# Patient Record
Sex: Female | Born: 1960
Health system: Southern US, Community
[De-identification: ages and names within clinical notes are randomized; demographics above are authoritative.]

---

## 2003-08-10 ENCOUNTER — Other Ambulatory Visit: Admission: RE | Admit: 2003-08-10 | Discharge: 2003-08-10 | Payer: Self-pay | Admitting: Family Medicine

## 2003-08-16 ENCOUNTER — Encounter: Admission: RE | Admit: 2003-08-16 | Discharge: 2003-08-16 | Payer: Self-pay | Admitting: Family Medicine

## 2004-02-28 ENCOUNTER — Ambulatory Visit (HOSPITAL_COMMUNITY): Admission: RE | Admit: 2004-02-28 | Discharge: 2004-02-28 | Payer: Self-pay | Admitting: Specialist

## 2004-10-10 ENCOUNTER — Other Ambulatory Visit: Admission: RE | Admit: 2004-10-10 | Discharge: 2004-10-10 | Payer: Self-pay | Admitting: Family Medicine

## 2004-11-01 ENCOUNTER — Ambulatory Visit (HOSPITAL_COMMUNITY): Admission: RE | Admit: 2004-11-01 | Discharge: 2004-11-01 | Payer: Self-pay | Admitting: Family Medicine

## 2007-10-29 ENCOUNTER — Other Ambulatory Visit: Admission: RE | Admit: 2007-10-29 | Discharge: 2007-10-29 | Payer: Self-pay | Admitting: Family Medicine

## 2007-11-11 ENCOUNTER — Ambulatory Visit (HOSPITAL_COMMUNITY): Admission: RE | Admit: 2007-11-11 | Discharge: 2007-11-11 | Payer: Self-pay | Admitting: Family Medicine

## 2009-03-30 ENCOUNTER — Emergency Department (HOSPITAL_COMMUNITY): Admission: EM | Admit: 2009-03-30 | Discharge: 2009-03-30 | Payer: Self-pay | Admitting: Emergency Medicine

## 2010-02-10 ENCOUNTER — Encounter: Payer: Self-pay | Admitting: Family Medicine

## 2010-02-11 ENCOUNTER — Encounter: Payer: Self-pay | Admitting: Family Medicine

## 2010-04-03 ENCOUNTER — Other Ambulatory Visit (HOSPITAL_COMMUNITY)
Admission: RE | Admit: 2010-04-03 | Discharge: 2010-04-03 | Disposition: A | Discharge status: Home / Self Care | Payer: BC Managed Care – PPO | Source: Ambulatory Visit | Attending: Family Medicine | Admitting: Family Medicine

## 2010-04-03 ENCOUNTER — Other Ambulatory Visit: Payer: Self-pay | Admitting: Family Medicine

## 2010-04-03 DIAGNOSIS — Z01419 Encounter for gynecological examination (general) (routine) without abnormal findings: Secondary | ICD-10-CM | POA: Insufficient documentation

## 2010-04-16 LAB — POCT I-STAT, CHEM 8
BUN: 10 mg/dL (ref 6–23)
Calcium, Ion: 1.14 mmol/L (ref 1.12–1.32)
Chloride: 107 mEq/L (ref 96–112)
Creatinine, Ser: 0.5 mg/dL (ref 0.4–1.2)
Glucose, Bld: 121 mg/dL — ABNORMAL HIGH (ref 70–99)
HCT: 26 % — ABNORMAL LOW (ref 36.0–46.0)
Hemoglobin: 8.8 g/dL — ABNORMAL LOW (ref 12.0–15.0)
Potassium: 3.7 mEq/L (ref 3.5–5.1)
Sodium: 137 mEq/L (ref 135–145)
TCO2: 24 mmol/L (ref 0–100)

## 2010-04-16 LAB — CBC
HCT: 25.5 % — ABNORMAL LOW (ref 36.0–46.0)
Hemoglobin: 8.4 g/dL — ABNORMAL LOW (ref 12.0–15.0)
MCHC: 32.8 g/dL (ref 30.0–36.0)
MCV: 76.2 fL — ABNORMAL LOW (ref 78.0–100.0)
Platelets: 382 10*3/uL (ref 150–400)
RBC: 3.34 MIL/uL — ABNORMAL LOW (ref 3.87–5.11)
RDW: 16.8 % — ABNORMAL HIGH (ref 11.5–15.5)
WBC: 8.1 10*3/uL (ref 4.0–10.5)

## 2010-04-16 LAB — D-DIMER, QUANTITATIVE: D-Dimer, Quant: <0.22 ug/mL-FEU (ref 0.00–0.48)

## 2011-02-06 ENCOUNTER — Other Ambulatory Visit: Payer: Self-pay | Admitting: Obstetrics and Gynecology

## 2013-06-24 ENCOUNTER — Other Ambulatory Visit (HOSPITAL_COMMUNITY): Payer: Self-pay | Admitting: Family Medicine

## 2013-06-24 DIAGNOSIS — Z1231 Encounter for screening mammogram for malignant neoplasm of breast: Secondary | ICD-10-CM

## 2013-06-25 ENCOUNTER — Ambulatory Visit (HOSPITAL_COMMUNITY): Admission: RE | Admit: 2013-06-25 | Payer: BC Managed Care – PPO | Source: Ambulatory Visit

## 2013-06-25 ENCOUNTER — Encounter (INDEPENDENT_AMBULATORY_CARE_PROVIDER_SITE_OTHER): Payer: Self-pay

## 2013-10-15 ENCOUNTER — Other Ambulatory Visit (HOSPITAL_COMMUNITY)
Admission: RE | Admit: 2013-10-15 | Discharge: 2013-10-15 | Disposition: A | Discharge status: Home / Self Care | Payer: BC Managed Care – PPO | Source: Ambulatory Visit | Attending: Family Medicine | Admitting: Family Medicine

## 2013-10-15 ENCOUNTER — Other Ambulatory Visit: Payer: Self-pay | Admitting: Family Medicine

## 2013-10-15 DIAGNOSIS — Z1151 Encounter for screening for human papillomavirus (HPV): Secondary | ICD-10-CM | POA: Diagnosis present

## 2013-10-15 DIAGNOSIS — Z124 Encounter for screening for malignant neoplasm of cervix: Secondary | ICD-10-CM | POA: Diagnosis not present

## 2013-10-18 LAB — CYTOLOGY - PAP

## 2015-06-12 DIAGNOSIS — J0100 Acute maxillary sinusitis, unspecified: Secondary | ICD-10-CM | POA: Diagnosis not present

## 2015-06-12 DIAGNOSIS — R05 Cough: Secondary | ICD-10-CM | POA: Diagnosis not present

## 2015-06-12 DIAGNOSIS — J189 Pneumonia, unspecified organism: Secondary | ICD-10-CM | POA: Diagnosis not present

## 2015-10-11 DIAGNOSIS — R05 Cough: Secondary | ICD-10-CM | POA: Diagnosis not present

## 2015-11-30 DIAGNOSIS — Z0000 Encounter for general adult medical examination without abnormal findings: Secondary | ICD-10-CM | POA: Diagnosis not present

## 2015-11-30 DIAGNOSIS — R7303 Prediabetes: Secondary | ICD-10-CM | POA: Diagnosis not present

## 2015-11-30 DIAGNOSIS — Z1159 Encounter for screening for other viral diseases: Secondary | ICD-10-CM | POA: Diagnosis not present

## 2015-11-30 DIAGNOSIS — Z1211 Encounter for screening for malignant neoplasm of colon: Secondary | ICD-10-CM | POA: Diagnosis not present

## 2016-01-16 DIAGNOSIS — J181 Lobar pneumonia, unspecified organism: Secondary | ICD-10-CM | POA: Diagnosis not present

## 2016-01-20 DIAGNOSIS — J209 Acute bronchitis, unspecified: Secondary | ICD-10-CM | POA: Diagnosis not present

## 2016-01-20 DIAGNOSIS — R0981 Nasal congestion: Secondary | ICD-10-CM | POA: Diagnosis not present

## 2016-01-20 DIAGNOSIS — J3489 Other specified disorders of nose and nasal sinuses: Secondary | ICD-10-CM | POA: Diagnosis not present

## 2016-01-20 DIAGNOSIS — R05 Cough: Secondary | ICD-10-CM | POA: Diagnosis not present

## 2016-09-13 DIAGNOSIS — R05 Cough: Secondary | ICD-10-CM | POA: Diagnosis not present

## 2016-09-13 DIAGNOSIS — J069 Acute upper respiratory infection, unspecified: Secondary | ICD-10-CM | POA: Diagnosis not present

## 2016-09-17 ENCOUNTER — Other Ambulatory Visit: Payer: Self-pay | Admitting: Family Medicine

## 2016-09-17 DIAGNOSIS — Z1231 Encounter for screening mammogram for malignant neoplasm of breast: Secondary | ICD-10-CM

## 2016-09-25 ENCOUNTER — Ambulatory Visit
Admission: RE | Admit: 2016-09-25 | Discharge: 2016-09-25 | Disposition: A | Discharge status: Home / Self Care | Payer: Self-pay | Source: Ambulatory Visit | Attending: Family Medicine | Admitting: Family Medicine

## 2016-09-25 DIAGNOSIS — Z1231 Encounter for screening mammogram for malignant neoplasm of breast: Secondary | ICD-10-CM | POA: Diagnosis not present

## 2016-12-04 DIAGNOSIS — H2513 Age-related nuclear cataract, bilateral: Secondary | ICD-10-CM | POA: Diagnosis not present

## 2016-12-04 DIAGNOSIS — E119 Type 2 diabetes mellitus without complications: Secondary | ICD-10-CM | POA: Diagnosis not present

## 2016-12-04 DIAGNOSIS — H11002 Unspecified pterygium of left eye: Secondary | ICD-10-CM | POA: Diagnosis not present

## 2016-12-04 DIAGNOSIS — H04123 Dry eye syndrome of bilateral lacrimal glands: Secondary | ICD-10-CM | POA: Diagnosis not present

## 2016-12-25 DIAGNOSIS — R7303 Prediabetes: Secondary | ICD-10-CM | POA: Diagnosis not present

## 2016-12-25 DIAGNOSIS — Z13220 Encounter for screening for lipoid disorders: Secondary | ICD-10-CM | POA: Diagnosis not present

## 2018-01-28 ENCOUNTER — Other Ambulatory Visit (HOSPITAL_COMMUNITY)
Admission: RE | Admit: 2018-01-28 | Discharge: 2018-01-28 | Disposition: A | Discharge status: Home / Self Care | Payer: BLUE CROSS/BLUE SHIELD | Source: Ambulatory Visit | Attending: Family Medicine | Admitting: Family Medicine

## 2018-01-28 ENCOUNTER — Other Ambulatory Visit: Payer: Self-pay | Admitting: Family Medicine

## 2018-01-28 DIAGNOSIS — Z124 Encounter for screening for malignant neoplasm of cervix: Secondary | ICD-10-CM | POA: Diagnosis not present

## 2018-01-28 DIAGNOSIS — E7800 Pure hypercholesterolemia, unspecified: Secondary | ICD-10-CM | POA: Diagnosis not present

## 2018-01-28 DIAGNOSIS — R7303 Prediabetes: Secondary | ICD-10-CM | POA: Diagnosis not present

## 2018-01-28 DIAGNOSIS — Z23 Encounter for immunization: Secondary | ICD-10-CM | POA: Diagnosis not present

## 2018-01-28 DIAGNOSIS — Z0000 Encounter for general adult medical examination without abnormal findings: Secondary | ICD-10-CM | POA: Diagnosis not present

## 2018-01-29 LAB — CYTOLOGY - PAP
Diagnosis: NEGATIVE
HPV: NOT DETECTED

## 2018-07-08 DIAGNOSIS — H11042 Peripheral pterygium, stationary, left eye: Secondary | ICD-10-CM | POA: Diagnosis not present

## 2018-07-08 DIAGNOSIS — H04123 Dry eye syndrome of bilateral lacrimal glands: Secondary | ICD-10-CM | POA: Diagnosis not present

## 2018-07-08 DIAGNOSIS — R7309 Other abnormal glucose: Secondary | ICD-10-CM | POA: Diagnosis not present

## 2018-07-08 DIAGNOSIS — H2513 Age-related nuclear cataract, bilateral: Secondary | ICD-10-CM | POA: Diagnosis not present

## 2018-07-10 DIAGNOSIS — R300 Dysuria: Secondary | ICD-10-CM | POA: Diagnosis not present

## 2018-07-10 DIAGNOSIS — R350 Frequency of micturition: Secondary | ICD-10-CM | POA: Diagnosis not present

## 2018-07-10 DIAGNOSIS — R3915 Urgency of urination: Secondary | ICD-10-CM | POA: Diagnosis not present

## 2019-03-24 ENCOUNTER — Encounter: Payer: Self-pay | Admitting: Family Medicine

## 2019-04-01 ENCOUNTER — Other Ambulatory Visit: Payer: Self-pay | Admitting: Family Medicine

## 2019-04-01 DIAGNOSIS — Z1231 Encounter for screening mammogram for malignant neoplasm of breast: Secondary | ICD-10-CM

## 2019-05-26 ENCOUNTER — Encounter: Payer: Self-pay | Admitting: Registered"

## 2019-05-26 ENCOUNTER — Encounter: Payer: 59 | Attending: Family Medicine | Admitting: Registered"

## 2019-05-26 ENCOUNTER — Other Ambulatory Visit: Payer: Self-pay

## 2019-05-26 DIAGNOSIS — R7303 Prediabetes: Secondary | ICD-10-CM | POA: Diagnosis present

## 2019-05-26 NOTE — Patient Instructions (Addendum)
-   Aim to have 1/2 plate non-starchy vegetables + 1/4 plate of protein + 1/4 plate of starch/grain for lunch and dinner. See handouts.   - Can have snack after dinner to include source of carbohydrates + protein such as fruit + nut, yogurt + nuts, etc.   - Keep up the great work with movement during the week; walking every other day at least 30 minutes.

## 2019-05-26 NOTE — Progress Notes (Signed)
Medical Nutrition Therapy:  Appt start time: 9:25 end time:  10:15.   Assessment:  Primary concerns today: Per referral, recent labs elevated A1c (6.4), Chol (186), and Trg (183).   Pt expectations: wants to know what helps you control diabetes levels & daily caloric amounts  Pt states her doctor wants her to be in good health. Has been working with her for 15+ years. Pt state she wants to try and avoid medication and manage things on her own.   Pt reports when labs were performed she recently jad family visiting, daughter got engaged, and she ate a lot during that time.   Reports she has lost 6-7 lbs since previous visit. States she has been very strict on food items: eating vegetables.  States she follows vegetarian diet + eggs; since 2000. States she will have sugar sometimes with tea. Reports she is no longer eating cakes or any other sweets. States she is drinking a lot of herbal teas and increasing intake of bitter melon. Reports some days she is having less than 1000 cals and sometimes 1200 cals. States she loves yogurt, does not want to add fat from milk although she likes milk. Reports doing intermitent fasting 10-6 pm currently; feels hungry daily around 8 pm but does not want to eat at that time. States tomatoes give her acid reflux; avoids them. States she goes to bed around 9-10 pm.   Sometimes she cannot manage of stress well. Works as Product manager. Reports virus effects in home country and some stress/concerns related to how its affecting family and people she's connected with.    Preferred Learning Style:   No preference indicated   Learning Readiness:   Ready  Change in progress   MEDICATIONS: See list   DIETARY INTAKE:  Usual eating pattern includes 2 meals and 1 snacks per day.  Everyday foods include fruit, pancakes, blueberries, nuts, yogurt, rice, vegetables, eggs.  Avoided foods include white flour, white rice, sugar, sugary things, tomatoes and  grapes.    24-hr recall:  B ( AM): buckwheat pancake + blueberries + 16 oz coffee + water  Snk ( AM):  L ( PM): yogurt  + nuts Snk ( PM):  D ( PM): gava rice + kale + beets + soup (tomato + potato + egg) Snk ( PM): herbal tea (sage, thyme, lemon bong) Beverages: herbal tea, coffee, water  Usual physical activity: walking 45 min, 4x/week  Estimated energy needs: 1800 calories 200 g carbohydrates 135 g protein 50 g fat  Progress Towards Goal(s):  In progress.   Nutritional Diagnosis:  NB-1.1 Food and nutrition-related knowledge deficit As related to prediabetes.  As evidenced by verbalized incomplete information.    Intervention:  Nutrition education and counseling. Pt was educated and counseled on prediabetes, how carbohydrates work in the body, role of fiber in eating regimen, and importance of physical activity. Discussed meal/snack planning and how to balance meals/snacks using MyPlate for prediabetes. Pt was in agreement with goals listed. Goals: - Aim to have 1/2 plate non-starchy vegetables + 1/4 plate of protein + 1/4 plate of starch/grain for lunch and dinner. See handouts.  - Can have snack after dinner to include source of carbohydrates + protein such as fruit + nut, yogurt + nuts, etc.  - Keep up the great work with movement during the week; walking every other day at least 30 minutes.    Teaching Method Utilized:  Visual Auditory Hands on  Handouts given during visit include:  My Plate for prediabtes  Barriers to learning/adherence to lifestyle change: none identifid  Demonstrated degree of understanding via:  Teach Back   Monitoring/Evaluation:  Dietary intake, exercise, and body weight prn.

## 2020-08-16 ENCOUNTER — Encounter (INDEPENDENT_AMBULATORY_CARE_PROVIDER_SITE_OTHER): Payer: Self-pay

## 2020-09-12 ENCOUNTER — Other Ambulatory Visit: Payer: Self-pay | Admitting: Family Medicine

## 2020-09-12 DIAGNOSIS — E2839 Other primary ovarian failure: Secondary | ICD-10-CM

## 2020-09-12 DIAGNOSIS — Z1231 Encounter for screening mammogram for malignant neoplasm of breast: Secondary | ICD-10-CM

## 2020-09-12 DIAGNOSIS — Z780 Asymptomatic menopausal state: Secondary | ICD-10-CM

## 2020-09-19 ENCOUNTER — Encounter (INDEPENDENT_AMBULATORY_CARE_PROVIDER_SITE_OTHER): Payer: Self-pay

## 2020-09-26 ENCOUNTER — Encounter (INDEPENDENT_AMBULATORY_CARE_PROVIDER_SITE_OTHER): Payer: Self-pay | Admitting: Ophthalmology

## 2020-09-26 ENCOUNTER — Encounter (INDEPENDENT_AMBULATORY_CARE_PROVIDER_SITE_OTHER): Payer: 59 | Admitting: Ophthalmology

## 2020-09-26 ENCOUNTER — Other Ambulatory Visit: Payer: Self-pay

## 2020-09-26 ENCOUNTER — Ambulatory Visit (INDEPENDENT_AMBULATORY_CARE_PROVIDER_SITE_OTHER): Payer: 59 | Admitting: Ophthalmology

## 2020-09-26 DIAGNOSIS — H2513 Age-related nuclear cataract, bilateral: Secondary | ICD-10-CM | POA: Diagnosis not present

## 2020-09-26 DIAGNOSIS — H35371 Puckering of macula, right eye: Secondary | ICD-10-CM | POA: Insufficient documentation

## 2020-09-26 DIAGNOSIS — H33101 Unspecified retinoschisis, right eye: Secondary | ICD-10-CM | POA: Insufficient documentation

## 2020-09-26 DIAGNOSIS — H2512 Age-related nuclear cataract, left eye: Secondary | ICD-10-CM | POA: Insufficient documentation

## 2020-09-26 NOTE — Assessment & Plan Note (Addendum)
The nature of macular pucker (epiretinal membrane ERM) was discussed with the patient as well as threshold criteria for vitrectomy surgery. I explained that in rare cases another surgery is needed to actually remove a second wrinkle should it regrow.  Most often, the epiretinal membrane and underlying wrinkled internal limiting membrane are removed with the first surgery, to accomplish the goals.   If the operative eye is Phakic (natural lens still present), cataract surgery is often recommended prior to Vitrectomy. This will enable the retina surgeon to have the best view during surgery and the patient to obtain optimal results in the future. Treatment options were discussed.  I have recommended at home monitoring the near vision task in a monocular (1 eye at a time), with or without near vision glasses, to look for changes or declines in reading.Foveal macular thickening with secondary foveal macular schisis right eye with impairment acuity.  Foveal macular schisis of this extent has a significant risk of permanent vision loss.

## 2020-09-26 NOTE — Progress Notes (Signed)
09/26/2020     CHIEF COMPLAINT Patient presents for  Chief Complaint  Patient presents with   Retina Evaluation      HISTORY OF PRESENT ILLNESS: Destiny Miles is a 60 y.o. female who presents to the clinic today for:   HPI     Retina Evaluation   In right eye.  This started 1 month ago.  Duration of 1 month.  Associated Symptoms Floaters (I have an old floater in my right eye).  Negative for Flashes.        Comments   NP- Significant ERM OD- Dr. Hortense Ramal Pt states, "I went for my regular exam and Dr. Dione Booze sent me here. I have  some time when I feel like my vision is blurred but it is not all the time. I also have a floater in my right eye."       Last edited by Demetrios Loll, COA on 09/26/2020  2:08 PM.      Referring physician: Ernesto Rutherford, MD [address] [place],  [place] [address]  HISTORICAL INFORMATION:   Selected notes from the MEDICAL RECORD NUMBER       CURRENT MEDICATIONS: No current outpatient medications on file. (Ophthalmic Drugs)   No current facility-administered medications for this visit. (Ophthalmic Drugs)   No current outpatient medications on file. (Other)   No current facility-administered medications for this visit. (Other)      REVIEW OF SYSTEMS:    ALLERGIES Not on File  PAST MEDICAL HISTORY History reviewed. No pertinent past medical history. History reviewed. No pertinent surgical history.  FAMILY HISTORY Family History  Problem Relation Age of Onset   Breast cancer Sister     SOCIAL HISTORY Social History   Tobacco Use   Smoking status: Never   Smokeless tobacco: Never         OPHTHALMIC EXAM:  Base Eye Exam     Visual Acuity (ETDRS)       Right Left   Dist [place] 20/30 20/40   Dist ph [place] 20/25 -2 20/25 +2         Tonometry (Tonopen, 2:13 PM)       Right Left   Pressure 11 10         Pupils       Pupils Dark Light Shape React APD   Right PERRL 4 3 Round Brisk None   Left PERRL 4  3 Round Brisk None         Visual Fields (Counting fingers)       Left Right    Full Full         Extraocular Movement       Right Left    Full Full         Neuro/Psych     Oriented x3: Yes         Dilation     Both eyes: 1.0% Mydriacyl, 2.5% Phenylephrine @ 2:13 PM           Slit Lamp and Fundus Exam     External Exam       Right Left   External Normal Normal         Slit Lamp Exam       Right Left   Lids/Lashes Normal Normal   Conjunctiva/Sclera White and quiet White and quiet   Cornea Clear Clear   Anterior Chamber Deep and quiet Deep and quiet   Iris Round and reactive Round and reactive  Lens 2+ Nuclear sclerosis 2+ Nuclear sclerosis   Anterior Vitreous Normal Normal         Fundus Exam       Right Left   Posterior Vitreous Normal Normal   Disc Normal Normal   C/D Ratio 0.4 0.4   Macula Epiretinal membrane, with moderate topographic distortion parent thickening however, Pseudohole, negative Watzke sign Normal   Vessels Normal Normal   Periphery Normal Normal            IMAGING AND PROCEDURES  Imaging and Procedures for 09/26/20  OCT, Retina - OU - Both Eyes       Right Eye Quality was good. Scan locations included subfoveal. Central Foveal Thickness: 443. Progression has no prior data. Findings include epiretinal membrane.   Left Eye Quality was good. Scan locations included subfoveal. Central Foveal Thickness: 265. Progression has no prior data. Findings include normal foveal contour.   Notes ERM OD with retinoschisis of the foveal macular region, no macular hole formation.     Color Fundus Photography Optos - OU - Both Eyes       Right Eye Progression has no prior data. Disc findings include normal observations. Macula : epiretinal membrane. Vessels : normal observations. Periphery : normal observations.   Left Eye Progression has no prior data. Disc findings include normal observations. Macula : normal  observations. Periphery : normal observations.              ASSESSMENT/PLAN:  Epiretinal membrane (ERM) of right eye The nature of macular pucker (epiretinal membrane ERM) was discussed with the patient as well as threshold criteria for vitrectomy surgery. I explained that in rare cases another surgery is needed to actually remove a second wrinkle should it regrow.  Most often, the epiretinal membrane and underlying wrinkled internal limiting membrane are removed with the first surgery, to accomplish the goals.   If the operative eye is Phakic (natural lens still present), cataract surgery is often recommended prior to Vitrectomy. This will enable the retina surgeon to have the best view during surgery and the patient to obtain optimal results in the future. Treatment options were discussed.  I have recommended at home monitoring the near vision task in a monocular (1 eye at a time), with or without near vision glasses, to look for changes or declines in reading.Foveal macular thickening with secondary foveal macular schisis right eye with impairment acuity.  Foveal macular schisis of this extent has a significant risk of permanent vision loss.  Nuclear sclerotic cataract of both eyes Moderate cataract change in each eye yet should surgical undertaking for repair of the epiretinal membrane be undertaken, cataract surgery with intraocular lens placement first would clear the visual axis so as to maximize visual acuity outcome with vitrectomy membrane peel the right eye  Macular retinoschisis of right eye Discovered on this date and secondary to severe epiretinal membrane     ICD-10-CM   1. Epiretinal membrane (ERM) of right eye  H35.371 OCT, Retina - OU - Both Eyes    Color Fundus Photography Optos - OU - Both Eyes    2. Nuclear sclerotic cataract of both eyes  H25.13     3. Macular retinoschisis of right eye  H33.101       1.  Pseudo macular hole of the right eye with negative Watzke  confirming that this is not a macular hole the right eye but rather foveal macular retinoschisis secondary to severe epiretinal membrane with thickening.  The extent of  foveal macular schisis is a cause of permanent vision impairment and limited recovery should it be allowed to progress or worsen.    2.  Based upon these considerations, I will recommend patient consider vitrectomy membrane peel of the right eye.  However that the amount of cataract present on the right eye should prompt cataract extraction with intraocular displacement on the right eye first and then a reassessment of the acuity and further discussion at that time.  3.  Return follow-up to Dr. Lyn Records and [facility] eye care for planning of cataract extraction with intraocular lens placement right eye  Follow-up here thereafter for reevaluation macular findings in the right eye  Ophthalmic Meds Ordered this visit:  No orders of the defined types were placed in this encounter.      Return in about 8 weeks (around 11/21/2020) for dilate, OD, OCT, after cataract surgery with [facility] is done first OD.  There are no Patient Instructions on file for this visit.   Explained the diagnoses, plan, and follow up with the patient and they expressed understanding.  Patient expressed understanding of the importance of proper follow up care.   Alford Highland Pranay Hilbun M.D. Diseases & Surgery of the Retina and Vitreous [facility] 09/26/20     Abbreviations: M myopia (nearsighted); A astigmatism; H hyperopia (farsighted); P presbyopia; Mrx spectacle prescription;  CTL contact lenses; OD right eye; OS left eye; OU both eyes  XT exotropia; ET esotropia; PEK punctate epithelial keratitis; PEE punctate epithelial erosions; DES dry eye syndrome; MGD meibomian gland dysfunction; ATs artificial tears; PFAT's preservative free artificial tears; NSC nuclear sclerotic cataract; PSC posterior subcapsular cataract; ERM epi-retinal  membrane; PVD posterior vitreous detachment; RD retinal detachment; DM diabetes mellitus; DR diabetic retinopathy; NPDR non-proliferative diabetic retinopathy; PDR proliferative diabetic retinopathy; CSME clinically significant macular edema; DME diabetic macular edema; dbh dot blot hemorrhages; CWS cotton wool spot; POAG primary open angle glaucoma; C/D cup-to-disc ratio; HVF humphrey visual field; GVF goldmann visual field; OCT optical coherence tomography; IOP intraocular pressure; BRVO Branch retinal vein occlusion; CRVO central retinal vein occlusion; CRAO central retinal artery occlusion; BRAO branch retinal artery occlusion; RT retinal tear; SB scleral buckle; PPV pars plana vitrectomy; VH Vitreous hemorrhage; PRP panretinal laser photocoagulation; IVK intravitreal kenalog; VMT vitreomacular traction; MH Macular hole;  NVD neovascularization of the disc; NVE neovascularization elsewhere; AREDS age related eye disease study; ARMD age related macular degeneration; POAG primary open angle glaucoma; EBMD epithelial/anterior basement membrane dystrophy; ACIOL anterior chamber intraocular lens; IOL intraocular lens; PCIOL posterior chamber intraocular lens; Phaco/IOL phacoemulsification with intraocular lens placement; PRK photorefractive keratectomy; LASIK laser assisted in situ keratomileusis; HTN hypertension; DM diabetes mellitus; COPD chronic obstructive pulmonary disease

## 2020-09-26 NOTE — Assessment & Plan Note (Signed)
Discovered on this date and secondary to severe epiretinal membrane

## 2020-09-26 NOTE — Assessment & Plan Note (Signed)
Moderate cataract change in each eye yet should surgical undertaking for repair of the epiretinal membrane be undertaken, cataract surgery with intraocular lens placement first would clear the visual axis so as to maximize visual acuity outcome with vitrectomy membrane peel the right eye

## 2020-10-11 ENCOUNTER — Other Ambulatory Visit: Payer: Self-pay

## 2020-10-11 ENCOUNTER — Ambulatory Visit
Admission: RE | Admit: 2020-10-11 | Discharge: 2020-10-11 | Disposition: A | Discharge status: Home / Self Care | Payer: 59 | Source: Ambulatory Visit | Attending: Family Medicine | Admitting: Family Medicine

## 2020-10-11 DIAGNOSIS — Z1231 Encounter for screening mammogram for malignant neoplasm of breast: Secondary | ICD-10-CM

## 2020-11-09 ENCOUNTER — Encounter (INDEPENDENT_AMBULATORY_CARE_PROVIDER_SITE_OTHER): Payer: Self-pay

## 2020-11-21 ENCOUNTER — Encounter (INDEPENDENT_AMBULATORY_CARE_PROVIDER_SITE_OTHER): Payer: Self-pay | Admitting: Ophthalmology

## 2020-11-21 ENCOUNTER — Other Ambulatory Visit: Payer: Self-pay

## 2020-11-21 ENCOUNTER — Ambulatory Visit (INDEPENDENT_AMBULATORY_CARE_PROVIDER_SITE_OTHER): Payer: 59 | Admitting: Ophthalmology

## 2020-11-21 DIAGNOSIS — H35371 Puckering of macula, right eye: Secondary | ICD-10-CM | POA: Diagnosis not present

## 2020-11-21 DIAGNOSIS — Z961 Presence of intraocular lens: Secondary | ICD-10-CM | POA: Insufficient documentation

## 2020-11-21 DIAGNOSIS — H33101 Unspecified retinoschisis, right eye: Secondary | ICD-10-CM

## 2020-11-21 NOTE — Progress Notes (Signed)
11/21/2020     CHIEF COMPLAINT Patient presents for  Chief Complaint  Patient presents with   Retina Follow Up      HISTORY OF PRESENT ILLNESS: Destiny Miles is a 60 y.o. female who presents to the clinic today for:   HPI     Retina Follow Up   Patient presents with  Other (ERM).  In right eye.  This started 8 weeks ago.  Severity is mild.  Duration of 8 weeks.  Since onset it is gradually improving.        Comments   8 week fu OD oct. Pt states "vision is a little better." Denies new FOL or floaters.  Pt states she had cataract surgery  in her right eye 11/09/2020.  Pt is using OMNI tapering, currently at three times a day in her right eye only.      Last edited by Nelva Nay on 11/21/2020  1:48 PM.      Referring physician: Sigmund Hazel, MD [address] [place],  [place] [address]  HISTORICAL INFORMATION:   Selected notes from the MEDICAL RECORD NUMBER       CURRENT MEDICATIONS: No current outpatient medications on file. (Ophthalmic Drugs)   No current facility-administered medications for this visit. (Ophthalmic Drugs)   No current outpatient medications on file. (Other)   No current facility-administered medications for this visit. (Other)      REVIEW OF SYSTEMS:    ALLERGIES Not on File  PAST MEDICAL HISTORY History reviewed. No pertinent past medical history. History reviewed. No pertinent surgical history.  FAMILY HISTORY Family History  Problem Relation Age of Onset   Breast cancer Sister     SOCIAL HISTORY Social History   Tobacco Use   Smoking status: Never   Smokeless tobacco: Never         OPHTHALMIC EXAM:  Base Eye Exam     Visual Acuity (ETDRS)       Right Left   Dist [place] 20/30 -1+2 20/50   Dist ph [place]  20/25 -2         Tonometry (Tonopen, 1:51 PM)       Right Left   Pressure 8 9         Pupils       Pupils Dark Light Shape React APD   Right PERRL 4 3 Round Brisk None   Left PERRL 4 3  Round Brisk None         Extraocular Movement       Right Left    Full Full         Neuro/Psych     Oriented x3: Yes   Mood/Affect: Normal         Dilation     Right eye: 1.0% Mydriacyl, 2.5% Phenylephrine @ 1:51 PM           Slit Lamp and Fundus Exam     External Exam       Right Left   External Normal Normal         Slit Lamp Exam       Right Left   Lids/Lashes Normal Normal   Conjunctiva/Sclera White and quiet White and quiet   Cornea Clear Clear   Anterior Chamber Deep and quiet Deep and quiet   Iris Round and reactive Round and reactive   Lens Centered posterior chamber intraocular lens 2+ Nuclear sclerosis   Anterior Vitreous Normal Normal  Fundus Exam       Right Left   Posterior Vitreous Normal    Disc Normal    C/D Ratio 0.4    Macula Epiretinal membrane, with moderate to severe topographic distortion, pseudo cystoid change, pseudohole, negative Watzke sign    Vessels Normal    Periphery Normal             IMAGING AND PROCEDURES  Imaging and Procedures for 11/21/20  OCT, Retina - OU - Both Eyes       Right Eye Quality was good. Scan locations included subfoveal. Central Foveal Thickness: 463. Progression has no prior data. Findings include epiretinal membrane, abnormal foveal contour.   Left Eye Quality was good. Scan locations included subfoveal. Central Foveal Thickness: 265. Progression has no prior data. Findings include normal foveal contour.   Notes ERM OD with retinoschisis of the foveal macular region, no macular hole formation.             ASSESSMENT/PLAN:  Pseudophakia, right eye OD with well centered IOL, now able to have vitrectomy membrane peel to halt progression of foveal macular schisis and vision loss in the right eye from epiretinal membrane  Patient has travel plans in the [place]  in mid November and returns early to mid January 2023  Epiretinal membrane (ERM) of right eye OD with  severe foveal macular schisis and macular thickening which is slightly increased after recent counter extraction intraocular lens placement.  Pseudo macular hole present with also some vision loss to 20/30.  Patient will require vitrectomy membrane peel once she returns from [place], in the coming months  Macular retinoschisis of right eye Slight increase in foveal macular schisis secondary to ERM     ICD-10-CM   1. Epiretinal membrane (ERM) of right eye  H35.371 OCT, Retina - OU - Both Eyes    2. Pseudophakia, right eye  Z96.1     3. Macular retinoschisis of right eye  H33.101       1.  OD, with progression of foveal macular schisis secondary to epiretinal membrane.  Patient has had pseudophakic status accomplished in the right eye to allow for vitrectomy and membrane peel the right eye.  Because of upcoming international travel plans we will delay this till mid-to-late January 2023  2.  3.  Ophthalmic Meds Ordered this visit:  No orders of the defined types were placed in this encounter.      Return in about 10 weeks (around 01/30/2021) for dilate, OD, OCT possible preop for vitrectomy membrane peel OD.  There are no Patient Instructions on file for this visit.   Explained the diagnoses, plan, and follow up with the patient and they expressed understanding.  Patient expressed understanding of the importance of proper follow up care.   Alford Highland Ranie Chinchilla M.D. Diseases & Surgery of the Retina and Vitreous [facility] 11/21/20     Abbreviations: M myopia (nearsighted); A astigmatism; H hyperopia (farsighted); P presbyopia; Mrx spectacle prescription;  CTL contact lenses; OD right eye; OS left eye; OU both eyes  XT exotropia; ET esotropia; PEK punctate epithelial keratitis; PEE punctate epithelial erosions; DES dry eye syndrome; MGD meibomian gland dysfunction; ATs artificial tears; PFAT's preservative free artificial tears; NSC nuclear sclerotic cataract; PSC  posterior subcapsular cataract; ERM epi-retinal membrane; PVD posterior vitreous detachment; RD retinal detachment; DM diabetes mellitus; DR diabetic retinopathy; NPDR non-proliferative diabetic retinopathy; PDR proliferative diabetic retinopathy; CSME clinically significant macular edema; DME diabetic macular edema; dbh dot blot  hemorrhages; CWS cotton wool spot; POAG primary open angle glaucoma; C/D cup-to-disc ratio; HVF humphrey visual field; GVF goldmann visual field; OCT optical coherence tomography; IOP intraocular pressure; BRVO Branch retinal vein occlusion; CRVO central retinal vein occlusion; CRAO central retinal artery occlusion; BRAO branch retinal artery occlusion; RT retinal tear; SB scleral buckle; PPV pars plana vitrectomy; VH Vitreous hemorrhage; PRP panretinal laser photocoagulation; IVK intravitreal kenalog; VMT vitreomacular traction; MH Macular hole;  NVD neovascularization of the disc; NVE neovascularization elsewhere; AREDS age related eye disease study; ARMD age related macular degeneration; POAG primary open angle glaucoma; EBMD epithelial/anterior basement membrane dystrophy; ACIOL anterior chamber intraocular lens; IOL intraocular lens; PCIOL posterior chamber intraocular lens; Phaco/IOL phacoemulsification with intraocular lens placement; [place] photorefractive keratectomy; LASIK laser assisted in situ keratomileusis; HTN hypertension; DM diabetes mellitus; COPD chronic obstructive pulmonary disease

## 2020-11-21 NOTE — Assessment & Plan Note (Signed)
OD with well centered IOL, now able to have vitrectomy membrane peel to halt progression of foveal macular schisis and vision loss in the right eye from epiretinal membrane  Patient has travel plans in the [place]  in mid November and returns early to mid January 2023

## 2020-11-21 NOTE — Assessment & Plan Note (Signed)
Slight increase in foveal macular schisis secondary to ERM

## 2020-11-21 NOTE — Assessment & Plan Note (Signed)
OD with severe foveal macular schisis and macular thickening which is slightly increased after recent counter extraction intraocular lens placement.  Pseudo macular hole present with also some vision loss to 20/30.  Patient will require vitrectomy membrane peel once she returns from [place], in the coming months

## 2021-01-30 ENCOUNTER — Ambulatory Visit (INDEPENDENT_AMBULATORY_CARE_PROVIDER_SITE_OTHER): Payer: 59 | Admitting: Ophthalmology

## 2021-01-30 ENCOUNTER — Encounter (INDEPENDENT_AMBULATORY_CARE_PROVIDER_SITE_OTHER): Payer: Self-pay | Admitting: Ophthalmology

## 2021-01-30 ENCOUNTER — Other Ambulatory Visit: Payer: Self-pay

## 2021-01-30 DIAGNOSIS — H35371 Puckering of macula, right eye: Secondary | ICD-10-CM

## 2021-01-30 DIAGNOSIS — H2512 Age-related nuclear cataract, left eye: Secondary | ICD-10-CM | POA: Diagnosis not present

## 2021-01-30 DIAGNOSIS — Z961 Presence of intraocular lens: Secondary | ICD-10-CM | POA: Diagnosis not present

## 2021-01-30 DIAGNOSIS — H33101 Unspecified retinoschisis, right eye: Secondary | ICD-10-CM | POA: Diagnosis not present

## 2021-01-30 NOTE — Assessment & Plan Note (Signed)
Patient does have some complaints referable to cataract

## 2021-01-30 NOTE — Assessment & Plan Note (Signed)
Secondary to epiretinal membrane, with no impact on acuity at this time.  No impairment or changes in the OCT anatomy of the foveal region the outer retina.  However schisis does and can progress to vision changes and we will operate to remove the epiretinal membrane as promptly as new onset visual acuity difficulties are current per tickly noted with near vision

## 2021-01-30 NOTE — Assessment & Plan Note (Signed)
OD still looks great

## 2021-01-30 NOTE — Assessment & Plan Note (Signed)
No change overall OD now pseudophakic in the right eye.  Patient reports with readers able still read adequately in the right eye with no impairment except the use of readers.  Equal and in degree of reading as of the left eye.  We will continue to thus observe

## 2021-01-30 NOTE — Progress Notes (Signed)
01/30/2021     CHIEF COMPLAINT Patient presents for  Chief Complaint  Patient presents with   Retina Follow Up      HISTORY OF PRESENT ILLNESS: Destiny Miles is a 61 y.o. female who presents to the clinic today for:   HPI     Retina Follow Up           Diagnosis: Other (ERM)   Laterality: right eye   Onset: 10 weeks ago   Severity: mild   Duration: 10 weeks   Course: gradually improving         Comments   10 week fu OD oct, possible pre op OD. Pt states vision has improved a little bit. Denies new FOL or floaters, pt states the one floater from previous visit is still there. Pt states her right eye vision is better than left eye because she had cataract sx 11/09/2020.      Last edited by Nelva Nay on 01/30/2021 11:07 AM.      Referring physician: Sallye Lat, MD [address] [place],  [place] [address]  HISTORICAL INFORMATION:   Selected notes from the MEDICAL RECORD NUMBER       CURRENT MEDICATIONS: No current outpatient medications on file. (Ophthalmic Drugs)   No current facility-administered medications for this visit. (Ophthalmic Drugs)   No current outpatient medications on file. (Other)   No current facility-administered medications for this visit. (Other)      REVIEW OF SYSTEMS:    ALLERGIES Not on File  PAST MEDICAL HISTORY History reviewed. No pertinent past medical history. History reviewed. No pertinent surgical history.  FAMILY HISTORY Family History  Problem Relation Age of Onset   Breast cancer Sister     SOCIAL HISTORY Social History   Tobacco Use   Smoking status: Never   Smokeless tobacco: Never         OPHTHALMIC EXAM:  Base Eye Exam     Visual Acuity (ETDRS)       Right Left   Dist [place] 20/30 20/60 -2   Dist ph [place]  20/25         Tonometry (Tonopen, 11:06 AM)       Right Left   Pressure 15 14         Pupils       Pupils Dark Light APD   Right PERRL 4 3 None    Left PERRL 4 3 None         Extraocular Movement       Right Left    Full Full         Neuro/Psych     Oriented x3: Yes   Mood/Affect: Normal         Dilation     Right eye: 1.0% Mydriacyl, 2.5% Phenylephrine @ 11:06 AM           Slit Lamp and Fundus Exam     External Exam       Right Left   External Normal Normal         Slit Lamp Exam       Right Left   Lids/Lashes Normal Normal   Conjunctiva/Sclera White and quiet White and quiet   Cornea Clear Clear   Anterior Chamber Deep and quiet Deep and quiet   Iris Round and reactive Round and reactive   Lens Centered posterior chamber intraocular lens 2+ Nuclear sclerosis   Anterior Vitreous Normal Normal  Fundus Exam       Right Left   Posterior Vitreous Posterior vitreous detachment, Central vitreous floaters    Disc Normal    C/D Ratio 0.4    Macula Epiretinal membrane, with moderate to severe topographic distortion, pseudo cystoid change, pseudohole, negative Watzke sign    Vessels Normal    Periphery Normal             IMAGING AND PROCEDURES  Imaging and Procedures for 01/30/21  OCT, Retina - OU - Both Eyes       Right Eye Quality was good. Scan locations included subfoveal. Central Foveal Thickness: 465. Progression has been stable. Findings include epiretinal membrane, abnormal foveal contour.   Left Eye Quality was good. Scan locations included subfoveal. Central Foveal Thickness: 265. Progression has been stable. Findings include normal foveal contour.   Notes ERM OD with retinoschisis of the foveal macular region, no macular hole formation.               ASSESSMENT/PLAN:  Epiretinal membrane (ERM) of right eye No change overall OD now pseudophakic in the right eye.  Patient reports with readers able still read adequately in the right eye with no impairment except the use of readers.  Equal and in degree of reading as of the left eye.  We will continue to thus  observe   Macular retinoschisis of right eye Secondary to epiretinal membrane, with no impact on acuity at this time.  No impairment or changes in the OCT anatomy of the foveal region the outer retina.  However schisis does and can progress to vision changes and we will operate to remove the epiretinal membrane as promptly as new onset visual acuity difficulties are current per tickly noted with near vision  Nuclear sclerotic cataract of left eye Patient does have some complaints referable to cataract  Pseudophakia, right eye OD still looks great     ICD-10-CM   1. Epiretinal membrane (ERM) of right eye  H35.371 OCT, Retina - OU - Both Eyes    2. Macular retinoschisis of right eye  H33.101     3. Nuclear sclerotic cataract of left eye  H25.12     4. Pseudophakia, right eye  Z96.1       1.  2.  3.  Ophthalmic Meds Ordered this visit:  No orders of the defined types were placed in this encounter.      Return in about 3 months (around 04/30/2021) for dilate, OD, OCT.  There are no Patient Instructions on file for this visit.   Explained the diagnoses, plan, and follow up with the patient and they expressed understanding.  Patient expressed understanding of the importance of proper follow up care.   Alford Highland Minnetta Sandora M.D. Diseases & Surgery of the Retina and Vitreous [facility] 01/30/21     Abbreviations: M myopia (nearsighted); A astigmatism; H hyperopia (farsighted); P presbyopia; Mrx spectacle prescription;  CTL contact lenses; OD right eye; OS left eye; OU both eyes  XT exotropia; ET esotropia; PEK punctate epithelial keratitis; PEE punctate epithelial erosions; DES dry eye syndrome; MGD meibomian gland dysfunction; ATs artificial tears; PFAT's preservative free artificial tears; NSC nuclear sclerotic cataract; PSC posterior subcapsular cataract; ERM epi-retinal membrane; PVD posterior vitreous detachment; RD retinal detachment; DM diabetes mellitus; DR  diabetic retinopathy; NPDR non-proliferative diabetic retinopathy; PDR proliferative diabetic retinopathy; CSME clinically significant macular edema; DME diabetic macular edema; dbh dot blot hemorrhages; CWS cotton wool spot; POAG primary open angle  glaucoma; C/D cup-to-disc ratio; HVF humphrey visual field; GVF goldmann visual field; OCT optical coherence tomography; IOP intraocular pressure; BRVO Branch retinal vein occlusion; CRVO central retinal vein occlusion; CRAO central retinal artery occlusion; BRAO branch retinal artery occlusion; RT retinal tear; SB scleral buckle; PPV pars plana vitrectomy; VH Vitreous hemorrhage; PRP panretinal laser photocoagulation; IVK intravitreal kenalog; VMT vitreomacular traction; MH Macular hole;  NVD neovascularization of the disc; NVE neovascularization elsewhere; AREDS age related eye disease study; ARMD age related macular degeneration; POAG primary open angle glaucoma; EBMD epithelial/anterior basement membrane dystrophy; ACIOL anterior chamber intraocular lens; IOL intraocular lens; PCIOL posterior chamber intraocular lens; Phaco/IOL phacoemulsification with intraocular lens placement; PRK photorefractive keratectomy; LASIK laser assisted in situ keratomileusis; HTN hypertension; DM diabetes mellitus; COPD chronic obstructive pulmonary disease

## 2021-04-30 ENCOUNTER — Encounter (INDEPENDENT_AMBULATORY_CARE_PROVIDER_SITE_OTHER): Payer: Self-pay | Admitting: Ophthalmology

## 2021-04-30 ENCOUNTER — Encounter (INDEPENDENT_AMBULATORY_CARE_PROVIDER_SITE_OTHER): Payer: 59 | Admitting: Ophthalmology

## 2021-04-30 ENCOUNTER — Ambulatory Visit (INDEPENDENT_AMBULATORY_CARE_PROVIDER_SITE_OTHER): Payer: 59 | Admitting: Ophthalmology

## 2021-04-30 DIAGNOSIS — Z961 Presence of intraocular lens: Secondary | ICD-10-CM | POA: Diagnosis not present

## 2021-04-30 DIAGNOSIS — H2512 Age-related nuclear cataract, left eye: Secondary | ICD-10-CM | POA: Diagnosis not present

## 2021-04-30 DIAGNOSIS — H35371 Puckering of macula, right eye: Secondary | ICD-10-CM

## 2021-04-30 DIAGNOSIS — H33101 Unspecified retinoschisis, right eye: Secondary | ICD-10-CM

## 2021-04-30 NOTE — Progress Notes (Signed)
? ? ?04/30/2021 ? ?  ? ?CHIEF COMPLAINT ?Patient presents for  ?Chief Complaint  ?Patient presents with  ? Retina Follow Up  ? ? ? ? ?HISTORY OF PRESENT ILLNESS: ?Destiny Miles is a 61 y.o. female who presents to the clinic today for:  ? ?HPI   ? ? Retina Follow Up   ? ?      ? Diagnosis: Other (Epiretinal Membrane)  ? Laterality: right eye  ? Onset: 3 months ago  ? ?  ?  ? ? Comments   ?3 mos fu Dilate OD, OCT. ?Patient states vision is stable and unchanged since last visit. Denies any new floaters or FOL.  ? ?  ?  ?Last edited by Nelva Nay on 04/30/2021  2:57 PM.  ?  ? ? ?Referring physician: ?Sigmund Hazel, MD ?[address] ?[place],  [place] [address] ? ?HISTORICAL INFORMATION:  ? ?Selected notes from the MEDICAL RECORD NUMBER ?  ?   ? ?CURRENT MEDICATIONS: ?No current outpatient medications on file. (Ophthalmic Drugs)  ? ?No current facility-administered medications for this visit. (Ophthalmic Drugs)  ? ?No current outpatient medications on file. (Other)  ? ?No current facility-administered medications for this visit. (Other)  ? ? ? ? ?REVIEW OF SYSTEMS: ? ? ? ?ALLERGIES ?Not on File ? ?PAST MEDICAL HISTORY ?History reviewed. No pertinent past medical history. ?History reviewed. No pertinent surgical history. ? ?FAMILY HISTORY ?Family History  ?Problem Relation Age of Onset  ? Breast cancer Sister   ? ? ?SOCIAL HISTORY ?Social History  ? ?Tobacco Use  ? Smoking status: Never  ? Smokeless tobacco: Never  ? ?  ? ?  ? ?OPHTHALMIC EXAM: ? ?Base Eye Exam   ? ? Visual Acuity (ETDRS)   ? ?   Right Left  ? Dist cc 20/20 -1 20/40 -2  ? Dist ph cc  20/30  ? ? Correction: Glasses  ? ?  ?  ? ? Tonometry (Tonopen, 3:00 PM)   ? ?   Right Left  ? Pressure 10 10  ? ?  ?  ? ? Pupils   ? ?   Pupils Dark Light APD  ? Right PERRL 4 3 None  ? Left PERRL 4 3 None  ? ?  ?  ? ? Visual Fields (Counting fingers)   ? ?   Left Right  ?  Full Full  ? ?  ?  ? ? Extraocular Movement   ? ?   Right Left  ?  Full Full  ? ?  ?  ? ?  Neuro/Psych   ? ? Oriented x3: Yes  ? Mood/Affect: Normal  ? ?  ?  ? ? Dilation   ? ? Right eye: 1.0% Mydriacyl, 2.5% Phenylephrine @ 3:00 PM  ? ?  ?  ? ?  ? ?Slit Lamp and Fundus Exam   ? ? External Exam   ? ?   Right Left  ? External Normal Normal  ? ?  ?  ? ? Slit Lamp Exam   ? ?   Right Left  ? Lids/Lashes Normal Normal  ? Conjunctiva/Sclera White and quiet White and quiet  ? Cornea Clear Clear  ? Anterior Chamber Deep and quiet Deep and quiet  ? Iris Round and reactive Round and reactive  ? Lens Centered posterior chamber intraocular lens 2+ Nuclear sclerosis  ? Anterior Vitreous Normal Normal  ? ?  ?  ? ? Fundus Exam   ? ?  Right Left  ? Posterior Vitreous Posterior vitreous detachment, Central vitreous floaters   ? Disc Normal   ? C/D Ratio 0.4   ? Macula Epiretinal membrane, with moderate to severe topographic distortion, pseudo cystoid change, pseudohole, negative Watzke sign   ? Vessels Normal   ? Periphery Normal   ? ?  ?  ? ?  ? ? ?IMAGING AND PROCEDURES  ?Imaging and Procedures for 04/30/21 ? ?OCT, Retina - OU - Both Eyes   ? ?   ?Right Eye ?Quality was good. Scan locations included subfoveal. Central Foveal Thickness: 447. Progression has been stable. Findings include epiretinal membrane, abnormal foveal contour.  ? ?Left Eye ?Quality was good. Scan locations included subfoveal. Central Foveal Thickness: 262. Progression has been stable. Findings include normal foveal contour.  ? ?Notes ?ERM OD with retinoschisis of the foveal macular region, no macular hole formation. ? ? ? ?  ? ? ?  ?  ? ?  ?ASSESSMENT/PLAN: ? ?Nuclear sclerotic cataract of left eye ?Good acuity currently, follow-up Dr. Dione Booze as scheduled ? ?Macular retinoschisis of right eye ?Foveal macular schisis secondary to epiretinal membrane OD, likely accounts for the patient's near vision difficulties even with reading glasses. ? ?Epiretinal membrane (ERM) of right eye ?Epiretinal membrane triggering secondary foveal macular schisis likely  accounts for patient's near vision reading difficulty in the right eye even with spectacle correction. ? ?This is the borderline example of proceeding to vitrectomy membrane peel to allow stabilization and potentially improvement of visual functioning in the right eye  ? ?  ICD-10-CM   ?1. Epiretinal membrane (ERM) of right eye  H35.371 OCT, Retina - OU - Both Eyes  ?  ?2. Pseudophakia, right eye  Z96.1   ?  ?3. Nuclear sclerotic cataract of left eye  H25.12   ?  ?4. Macular retinoschisis of right eye  H33.101   ?  ? ? ?1.  OD, with foveal macular schisis impacting visual acuity despite recent cataract surgery and intraocular ends implantation and new transition lenses. ? ?2.  Patient would like to wait and follow-up again in 5 to 6 months to monitor this condition and plan for LAD at the end of year 2023 vitrectomy membrane peel if condition still the same or worsening ? ?3. ? ?Ophthalmic Meds Ordered this visit:  ?No orders of the defined types were placed in this encounter. ? ? ?  ? ?Return in about 6 months (around 10/30/2021) for dilate, OD, OCT. ? ?There are no Patient Instructions on file for this visit. ? ? ?Explained the diagnoses, plan, and follow up with the patient and they expressed understanding.  Patient expressed understanding of the importance of proper follow up care.  ? ?Alford Highland. Jeromie Gainor M.D. ?Diseases & Surgery of the Retina and Vitreous ?[facility] ?04/30/21 ? ? ? ? ?Abbreviations: ?M myopia (nearsighted); A astigmatism; H hyperopia (farsighted); P presbyopia; Mrx spectacle prescription;  CTL contact lenses; OD right eye; OS left eye; OU both eyes  XT exotropia; ET esotropia; PEK punctate epithelial keratitis; PEE punctate epithelial erosions; DES dry eye syndrome; MGD meibomian gland dysfunction; ATs artificial tears; PFAT's preservative free artificial tears; NSC nuclear sclerotic cataract; PSC posterior subcapsular cataract; ERM epi-retinal membrane; PVD posterior vitreous  detachment; RD retinal detachment; DM diabetes mellitus; DR diabetic retinopathy; NPDR non-proliferative diabetic retinopathy; PDR proliferative diabetic retinopathy; CSME clinically significant macular edema; DME diabetic macular edema; dbh dot blot hemorrhages; CWS cotton wool spot; POAG primary open angle glaucoma; C/D cup-to-disc  ratio; HVF humphrey visual field; GVF goldmann visual field; OCT optical coherence tomography; IOP intraocular pressure; BRVO Branch retinal vein occlusion; CRVO central retinal vein occlusion; CRAO central retinal artery occlusion; BRAO branch retinal artery occlusion; RT retinal tear; SB scleral buckle; PPV pars plana vitrectomy; VH Vitreous hemorrhage; PRP panretinal laser photocoagulation; IVK intravitreal kenalog; VMT vitreomacular traction; MH Macular hole;  NVD neovascularization of the disc; NVE neovascularization elsewhere; AREDS age related eye disease study; ARMD age related macular degeneration; POAG primary open angle glaucoma; EBMD epithelial/anterior basement membrane dystrophy; ACIOL anterior chamber intraocular lens; IOL intraocular lens; PCIOL posterior chamber intraocular lens; Phaco/IOL phacoemulsification with intraocular lens placement; PRK photorefractive keratectomy; LASIK laser assisted in situ keratomileusis; HTN hypertension; DM diabetes mellitus; COPD chronic obstructive pulmonary disease ?

## 2021-04-30 NOTE — Assessment & Plan Note (Signed)
Foveal macular schisis secondary to epiretinal membrane OD, likely accounts for the patient's near vision difficulties even with reading glasses. ?

## 2021-04-30 NOTE — Assessment & Plan Note (Signed)
Good acuity currently, follow-up Dr. Dione Booze as scheduled ?

## 2021-04-30 NOTE — Assessment & Plan Note (Signed)
Epiretinal membrane triggering secondary foveal macular schisis likely accounts for patient's near vision reading difficulty in the right eye even with spectacle correction. ? ?This is the borderline example of proceeding to vitrectomy membrane peel to allow stabilization and potentially improvement of visual functioning in the right eye ?

## 2021-09-10 ENCOUNTER — Other Ambulatory Visit: Payer: Self-pay | Admitting: Family Medicine

## 2021-09-10 DIAGNOSIS — Z780 Asymptomatic menopausal state: Secondary | ICD-10-CM

## 2021-09-10 DIAGNOSIS — Z1231 Encounter for screening mammogram for malignant neoplasm of breast: Secondary | ICD-10-CM

## 2021-10-23 ENCOUNTER — Encounter (INDEPENDENT_AMBULATORY_CARE_PROVIDER_SITE_OTHER): Payer: 59 | Admitting: Ophthalmology

## 2021-10-30 ENCOUNTER — Encounter (INDEPENDENT_AMBULATORY_CARE_PROVIDER_SITE_OTHER): Payer: 59 | Admitting: Ophthalmology

## 2021-11-19 ENCOUNTER — Encounter (INDEPENDENT_AMBULATORY_CARE_PROVIDER_SITE_OTHER): Payer: 59 | Admitting: Ophthalmology

## 2022-09-25 ENCOUNTER — Other Ambulatory Visit (HOSPITAL_COMMUNITY): Payer: Self-pay | Admitting: Family Medicine

## 2022-09-25 DIAGNOSIS — E7800 Pure hypercholesterolemia, unspecified: Secondary | ICD-10-CM

## 2022-09-30 ENCOUNTER — Ambulatory Visit (HOSPITAL_COMMUNITY)
Admission: RE | Admit: 2022-09-30 | Discharge: 2022-09-30 | Disposition: A | Discharge status: Home / Self Care | Payer: 59 | Source: Ambulatory Visit | Attending: Family Medicine | Admitting: Family Medicine

## 2022-09-30 DIAGNOSIS — E7800 Pure hypercholesterolemia, unspecified: Secondary | ICD-10-CM

## 2022-11-10 IMAGING — MG MM DIGITAL SCREENING BILAT W/ TOMO AND CAD
8 series · 8 of 24 positions shown · non-contrast
Comparison: Previous exam(s).

CLINICAL DATA: Screening.

EXAM:
DIGITAL SCREENING BILATERAL MAMMOGRAM WITH TOMOSYNTHESIS AND CAD
TECHNIQUE: Bilateral screening digital craniocaudal and mediolateral oblique
mammograms were obtained. Bilateral screening digital breast
tomosynthesis was performed. The images were evaluated with
computer-aided detection.

[R CC synth-2D]
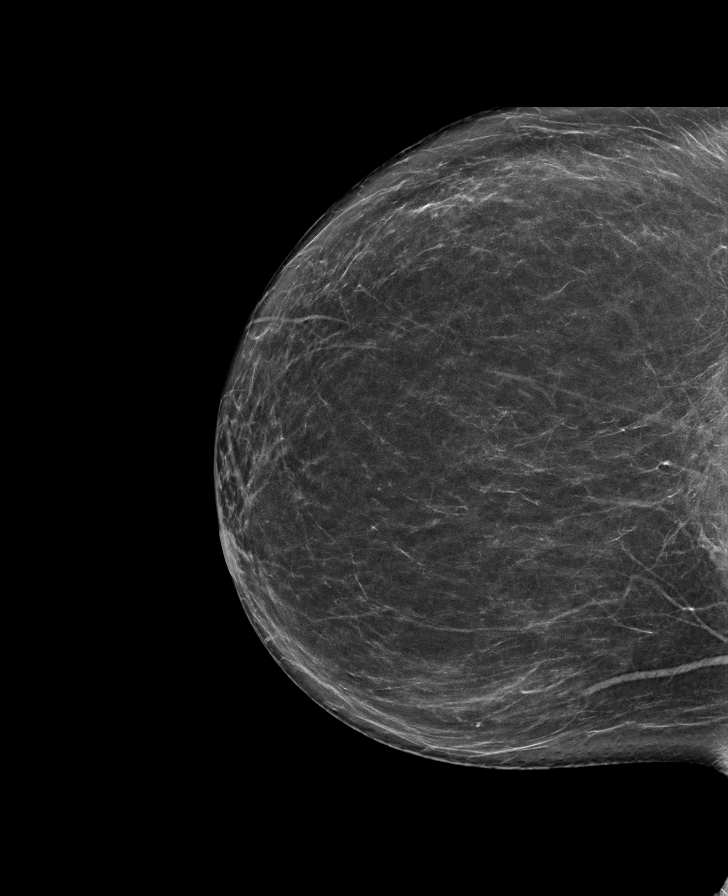

[L CC synth-2D]
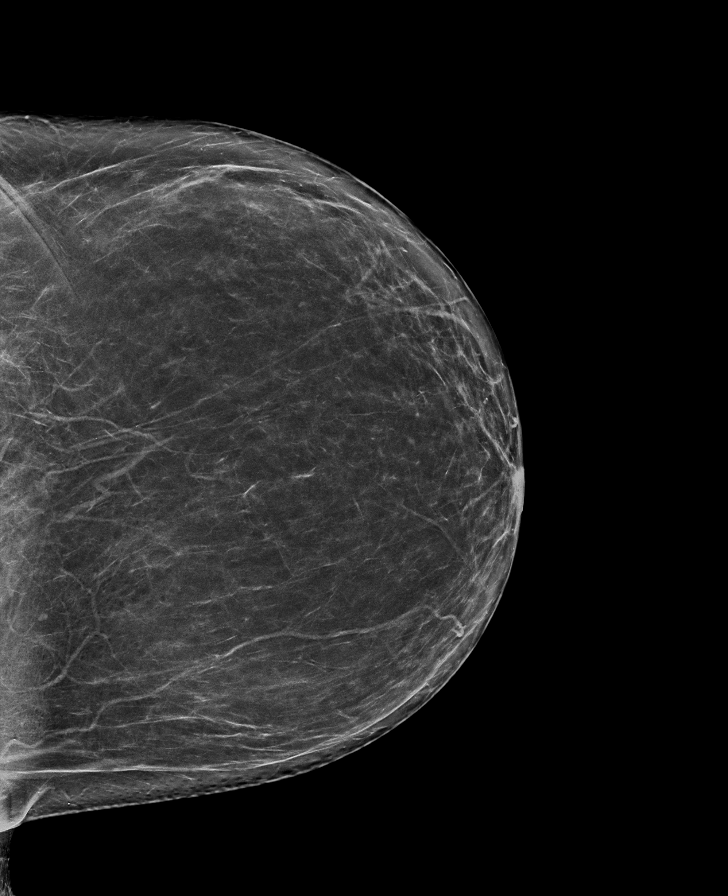

[L MLO synth-2D]
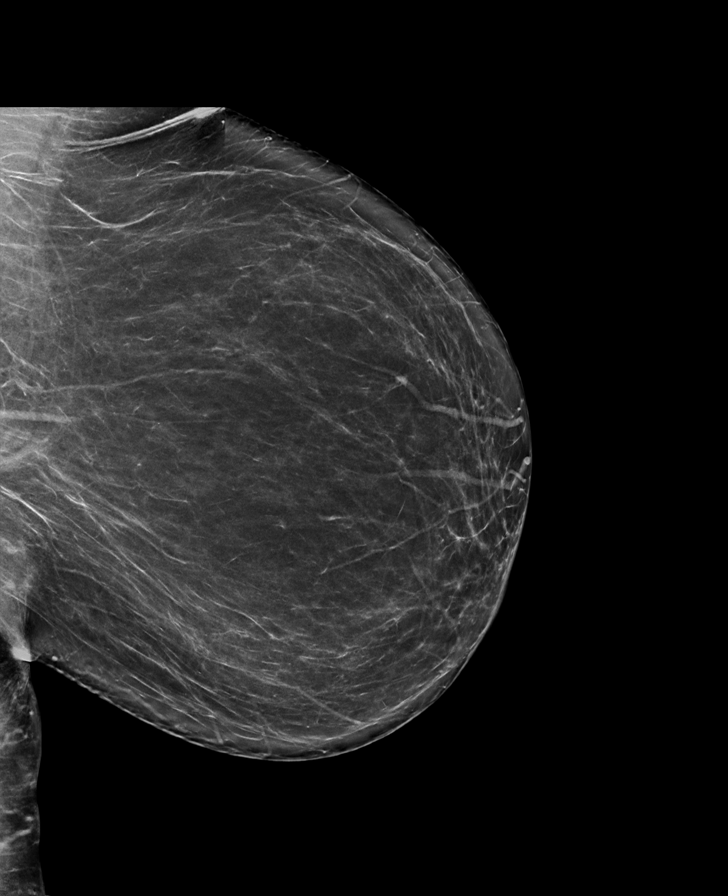

[R MLO synth-2D]
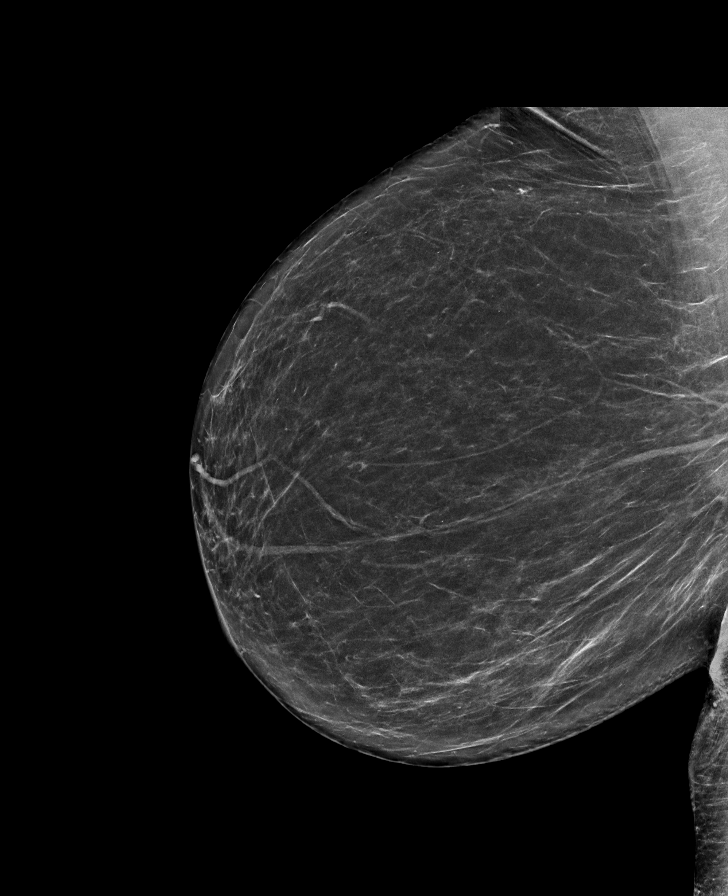

[L CC tomo · tomo slice 39/76.0]
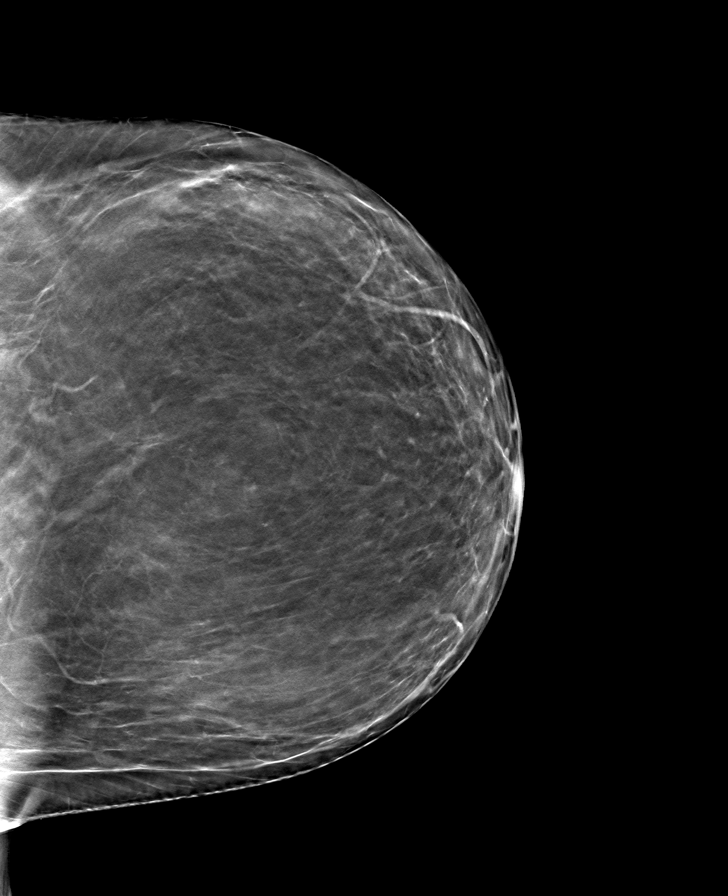

[R CC tomo · tomo slice 37/72.0]
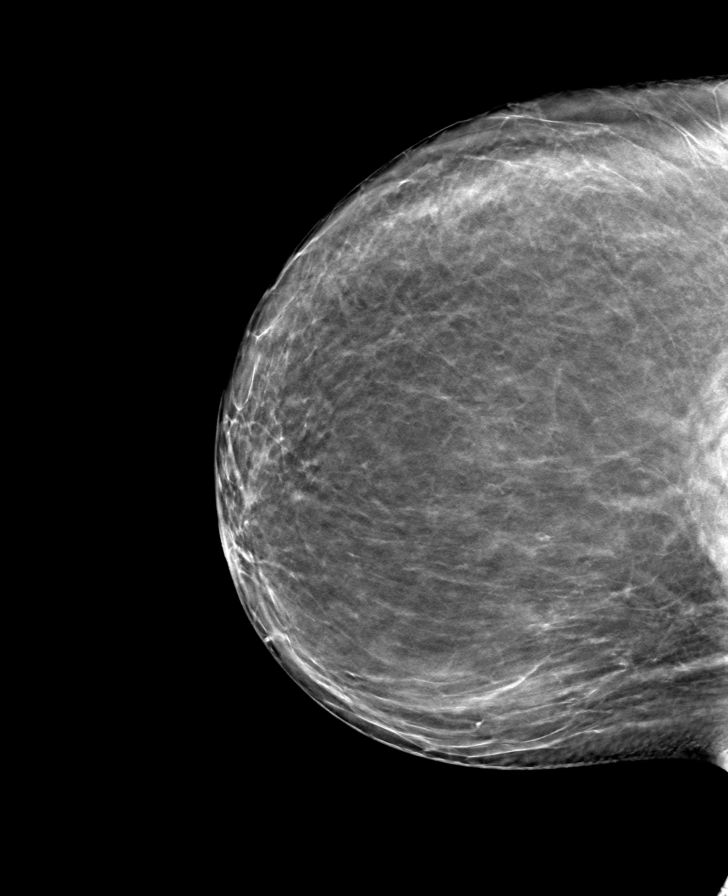

[L MLO tomo · tomo slice 41/81.0]
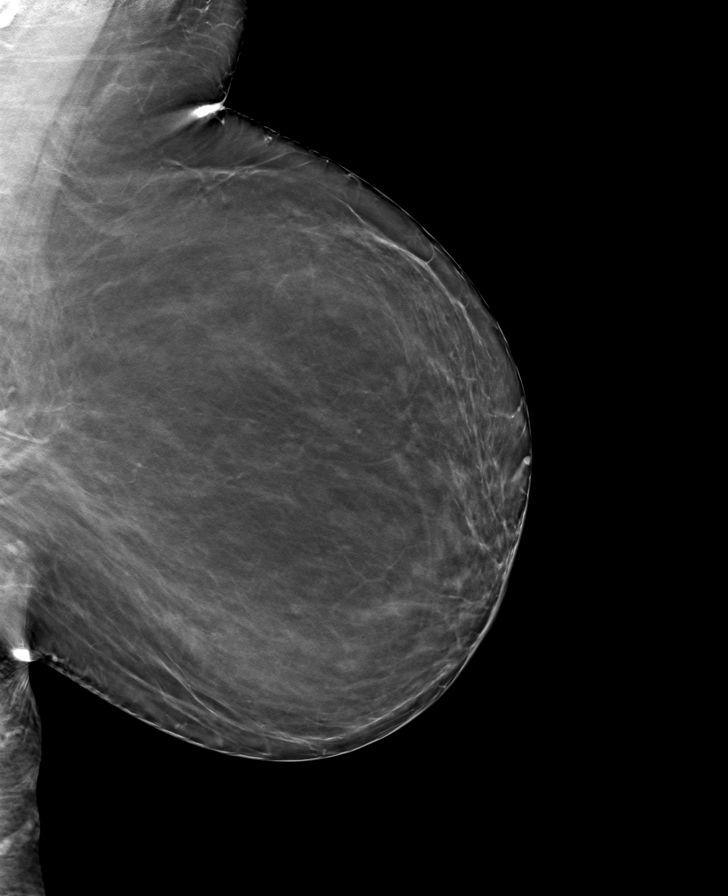

[R MLO tomo · tomo slice 39/76.0]
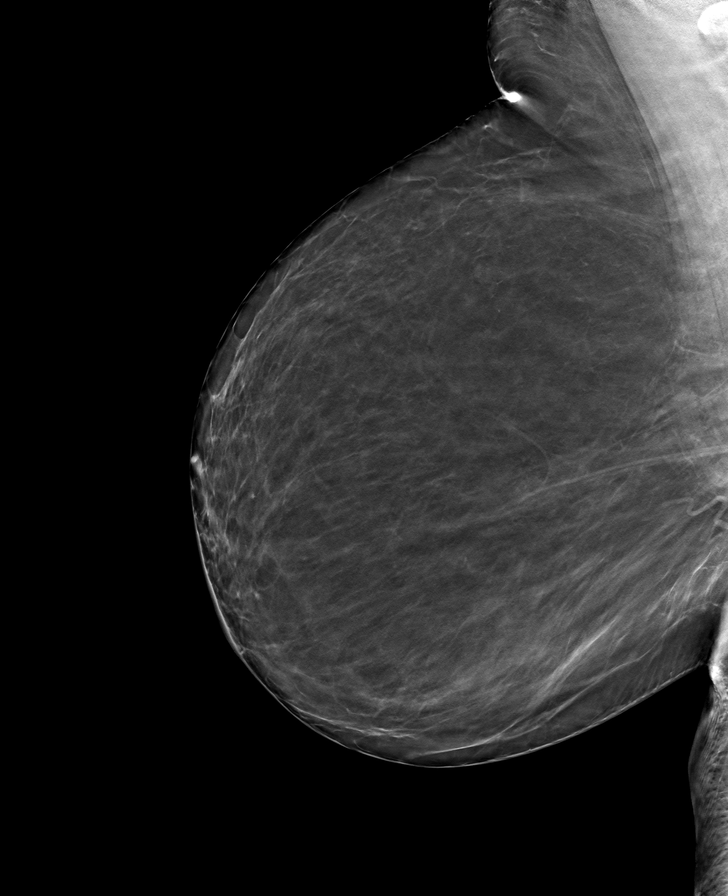

[8 of 24 positions shown; findings below may reference images not displayed]

ACR Breast Density Category b: There are scattered areas of
fibroglandular density.
FINDINGS: There are no findings suspicious for malignancy.
IMPRESSION: No mammographic evidence of malignancy. A result letter of this
screening mammogram will be mailed directly to the patient.

RECOMMENDATION:
Screening mammogram in one year. (Code:51-O-LD2)

BI-RADS CATEGORY  1: Negative.

## 2023-09-08 DIAGNOSIS — R7303 Prediabetes: Secondary | ICD-10-CM | POA: Diagnosis not present

## 2023-09-08 DIAGNOSIS — E7800 Pure hypercholesterolemia, unspecified: Secondary | ICD-10-CM | POA: Diagnosis not present

## 2023-09-15 DIAGNOSIS — Z86018 Personal history of other benign neoplasm: Secondary | ICD-10-CM | POA: Diagnosis not present

## 2023-09-15 DIAGNOSIS — I2510 Atherosclerotic heart disease of native coronary artery without angina pectoris: Secondary | ICD-10-CM | POA: Diagnosis not present

## 2023-09-15 DIAGNOSIS — Z124 Encounter for screening for malignant neoplasm of cervix: Secondary | ICD-10-CM | POA: Diagnosis not present

## 2023-09-15 DIAGNOSIS — E7800 Pure hypercholesterolemia, unspecified: Secondary | ICD-10-CM | POA: Diagnosis not present

## 2023-09-15 DIAGNOSIS — Z23 Encounter for immunization: Secondary | ICD-10-CM | POA: Diagnosis not present

## 2023-09-15 DIAGNOSIS — R7303 Prediabetes: Secondary | ICD-10-CM | POA: Diagnosis not present

## 2023-09-15 DIAGNOSIS — Z6834 Body mass index (BMI) 34.0-34.9, adult: Secondary | ICD-10-CM | POA: Diagnosis not present

## 2023-09-15 DIAGNOSIS — Z780 Asymptomatic menopausal state: Secondary | ICD-10-CM | POA: Diagnosis not present

## 2023-09-15 DIAGNOSIS — Z0000 Encounter for general adult medical examination without abnormal findings: Secondary | ICD-10-CM | POA: Diagnosis not present

## 2023-09-16 ENCOUNTER — Other Ambulatory Visit (HOSPITAL_BASED_OUTPATIENT_CLINIC_OR_DEPARTMENT_OTHER): Payer: Self-pay | Admitting: Family Medicine

## 2023-09-16 DIAGNOSIS — Z780 Asymptomatic menopausal state: Secondary | ICD-10-CM

## 2023-10-15 DIAGNOSIS — H11042 Peripheral pterygium, stationary, left eye: Secondary | ICD-10-CM | POA: Diagnosis not present

## 2023-10-15 DIAGNOSIS — H35371 Puckering of macula, right eye: Secondary | ICD-10-CM | POA: Diagnosis not present

## 2023-10-15 DIAGNOSIS — H26491 Other secondary cataract, right eye: Secondary | ICD-10-CM | POA: Diagnosis not present

## 2023-10-15 DIAGNOSIS — H2512 Age-related nuclear cataract, left eye: Secondary | ICD-10-CM | POA: Diagnosis not present

## 2023-10-15 DIAGNOSIS — H43813 Vitreous degeneration, bilateral: Secondary | ICD-10-CM | POA: Diagnosis not present

## 2023-10-15 DIAGNOSIS — H04123 Dry eye syndrome of bilateral lacrimal glands: Secondary | ICD-10-CM | POA: Diagnosis not present

## 2024-03-31 ENCOUNTER — Other Ambulatory Visit (HOSPITAL_BASED_OUTPATIENT_CLINIC_OR_DEPARTMENT_OTHER)
